# Patient Record
Sex: Male | Born: 1960 | Race: White | Hispanic: No | Marital: Married | Smoking: Never smoker
Health system: Southern US, Community
[De-identification: ages and names within clinical notes are randomized; demographics above are authoritative.]

## PROBLEM LIST (undated history)

## (undated) HISTORY — PX: APPENDECTOMY: SHX54

## (undated) HISTORY — PX: TONSILLECTOMY: SUR1361

## (undated) HISTORY — PX: COLONOSCOPY: SHX5424

---

## 1999-08-06 ENCOUNTER — Encounter (INDEPENDENT_AMBULATORY_CARE_PROVIDER_SITE_OTHER): Payer: Self-pay | Admitting: Specialist

## 1999-08-06 ENCOUNTER — Ambulatory Visit (HOSPITAL_COMMUNITY): Admission: RE | Admit: 1999-08-06 | Discharge: 1999-08-06 | Payer: Self-pay | Admitting: *Deleted

## 2002-10-12 ENCOUNTER — Ambulatory Visit (HOSPITAL_COMMUNITY): Admission: RE | Admit: 2002-10-12 | Discharge: 2002-10-12 | Payer: Self-pay | Admitting: General Surgery

## 2002-10-12 ENCOUNTER — Encounter (INDEPENDENT_AMBULATORY_CARE_PROVIDER_SITE_OTHER): Payer: Self-pay | Admitting: Specialist

## 2007-03-12 ENCOUNTER — Emergency Department (HOSPITAL_COMMUNITY): Admission: EM | Admit: 2007-03-12 | Discharge: 2007-03-12 | Payer: Self-pay | Admitting: Family Medicine

## 2007-03-20 ENCOUNTER — Ambulatory Visit (HOSPITAL_COMMUNITY): Admission: RE | Admit: 2007-03-20 | Discharge: 2007-03-20 | Payer: Self-pay | Admitting: Urology

## 2008-03-08 ENCOUNTER — Ambulatory Visit (HOSPITAL_COMMUNITY): Admission: RE | Admit: 2008-03-08 | Discharge: 2008-03-08 | Payer: Self-pay | Admitting: Gastroenterology

## 2008-07-29 IMAGING — CR DG ABDOMEN 1V
2 series · 2 of 2 positions shown · non-contrast
Comparison: 03/12/2007 abdominal radiograph and CT from [HOSPITAL] on 03/13/2007.

CLINICAL DATA: Preoperative, right ureteral stone.
 ABDOMEN - 1 VIEW:

[view not recorded (1 of 2)]
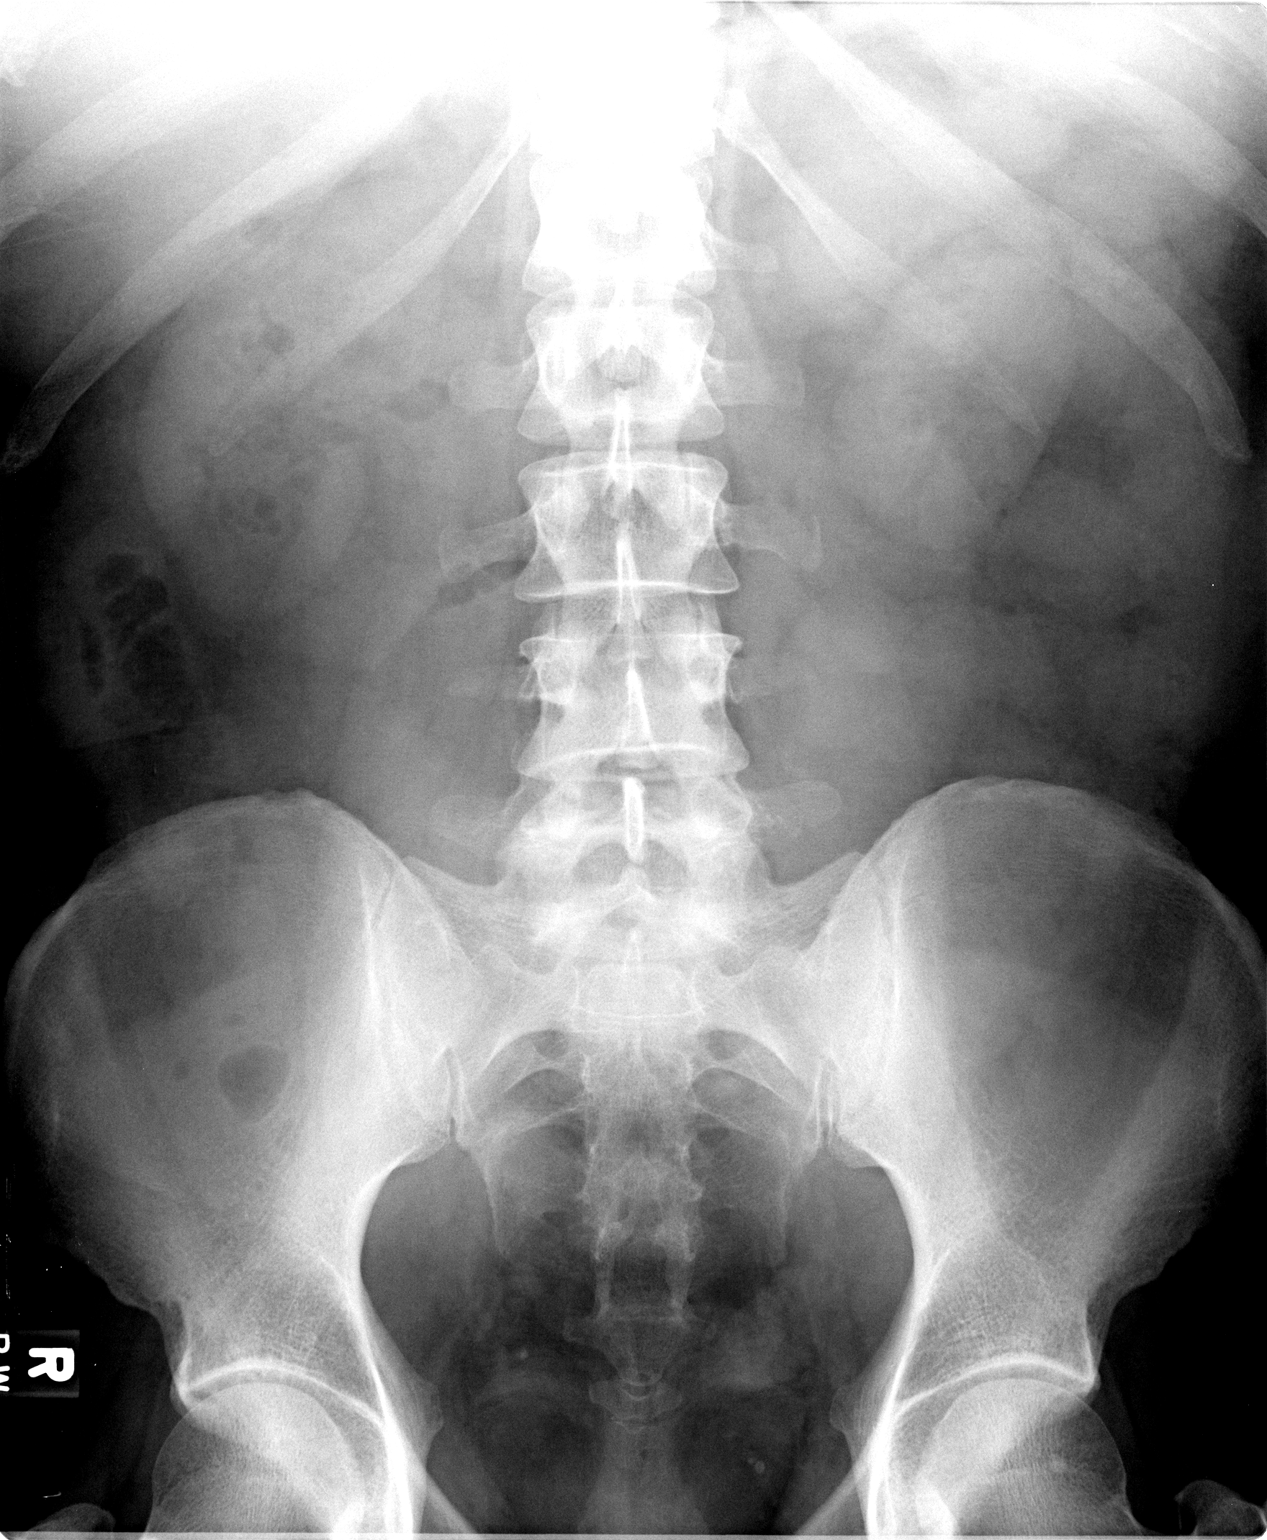

[view not recorded (2 of 2)]
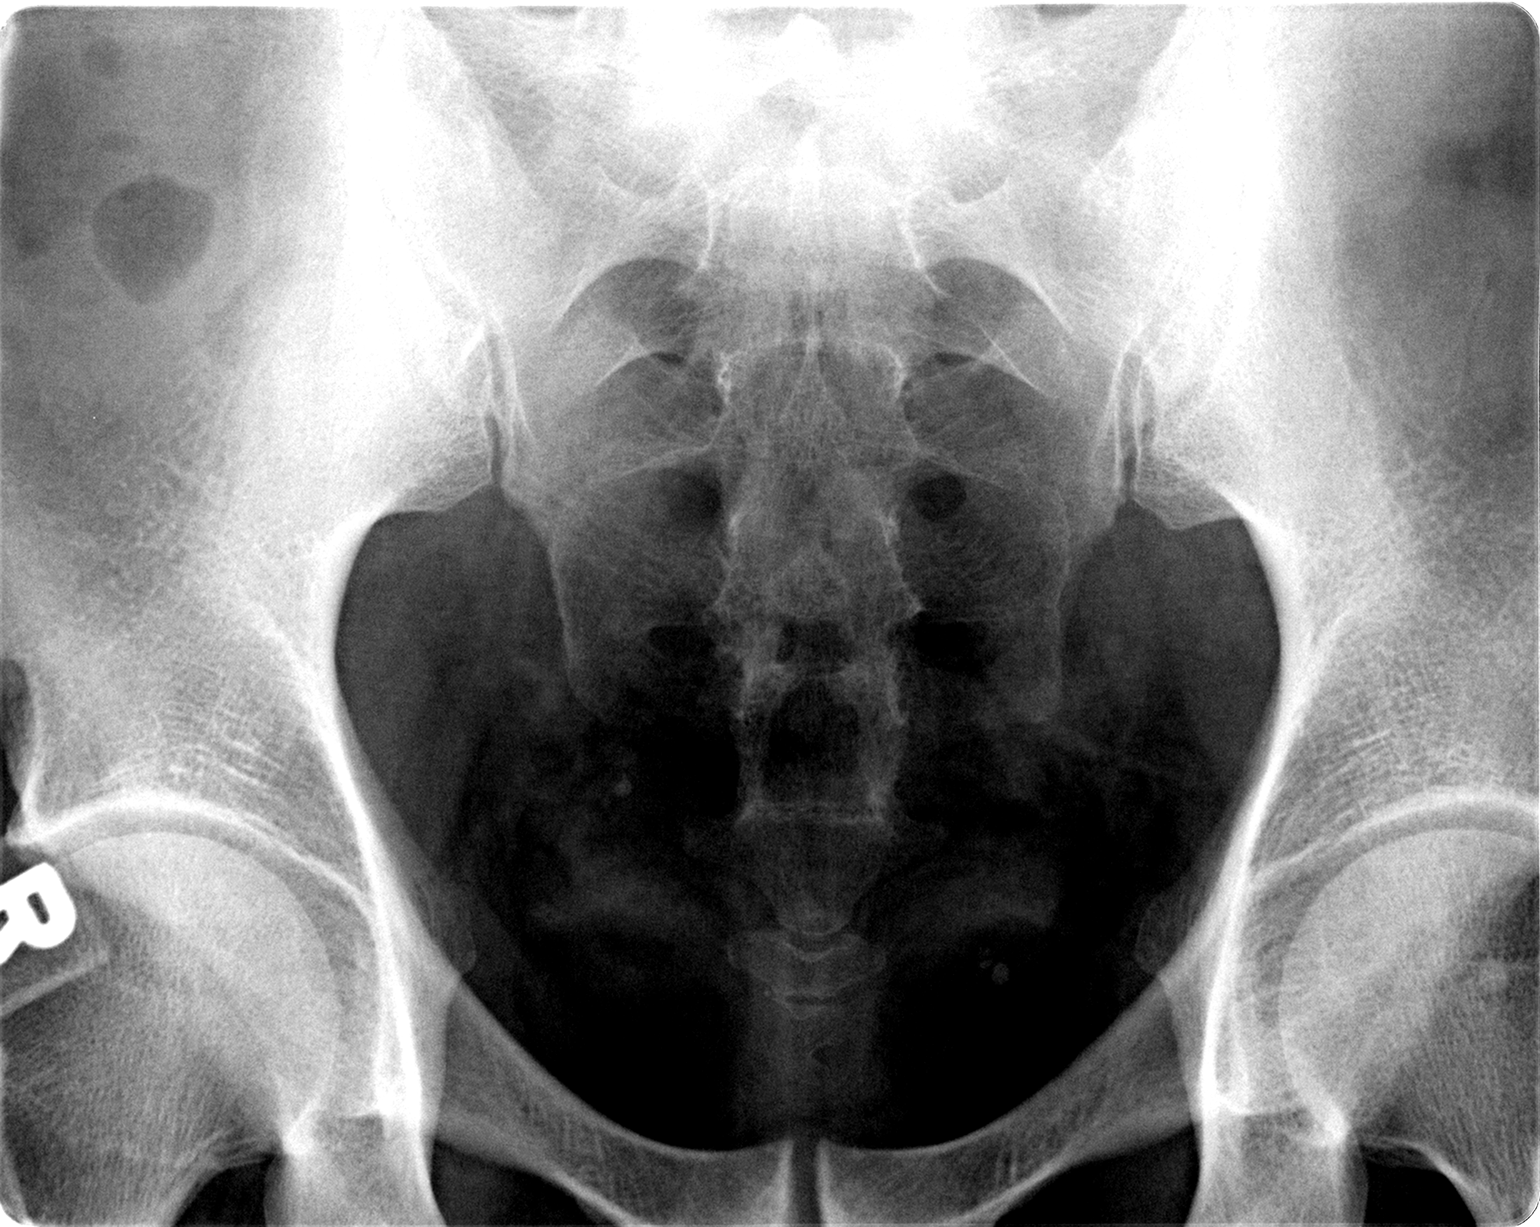

[2 of 2 positions shown; findings below may reference images not displayed]

FINDINGS: The right ureteral calculus appears to have migrated distally over the expected location of the right ureterovesical junction.   No radiopaque calculus is seen over either kidney or the expected course of either ureter elsewhere.   Pelvic phleboliths noted on the left.  Bones are unremarkable.
IMPRESSION: Apparent migration of the previously seen right ureteral calculus to the approximate level of the ureterovesical junction.

## 2010-10-13 NOTE — Op Note (Signed)
Brandon Harrison, Brandon Harrison NO.:  [id number]   MEDICAL RECORD NO.:  [id number]          PATIENT TYPE:  AMB   LOCATION:  ENDO                         FACILITY:  [facility]   PHYSICIAN:  Leo C. Madilyn Fireman, M.D.    DATE OF BIRTH:  Oct 16, 1960   DATE OF PROCEDURE:  03/08/2008  DATE OF DISCHARGE:                               OPERATIVE REPORT   INDICATIONS FOR PROCEDURE:  History of adenomatous colon polyps 5 years  ago.   PROCEDURE:  The patient was placed in the left lateral decubitus  position and placed on the pulse monitor with continuous low-flow oxygen  delivered by nasal cannula.  He was sedated with 100 mcg IV fentanyl and  10 mg IV Versed.  The Olympus video colonoscope was inserted into the  rectum and advanced to the cecum, confirmed by transillumination the  McBurney point and visualization of ileocecal valve and appendiceal  orifice.  The prep was excellent.  The cecum, ascending, transverse,  descending, and sigmoid colon all appeared normal with no masses,  polyps, diverticula, or other mucosal abnormalities.  The rectum  likewise appeared normal.  Retroflexed view of the anus revealed no  obvious internal hemorrhoids.  The scope was then withdrawn, and the  patient returned to the recovery room in stable condition.  He tolerated  the procedure well and there were no immediate complications.   IMPRESSION:  Normal colonoscopy.   PLAN:  Repeat colonoscopy in 5 years.           ______________________________  Everardo All Madilyn Fireman, M.D.     JCH/MEDQ  D:  03/08/2008  T:  03/08/2008  Job:  [id number]

## 2010-10-16 NOTE — Op Note (Signed)
   NAME:  Brandon Harrison, Brandon Harrison                          ACCOUNT NO.:  [id number]   MEDICAL RECORD NO.:  [id number]                   PATIENT TYPE:  AMB   LOCATION:  DAY                                  [facility]ACILITY[facility]  [facility]   PHYSICIAN:  Timothy E. Earlene Plater, M.D.              DATE OF BIRTH:  1960-09-25   DATE OF PROCEDURE:  10/12/2002  DATE OF DISCHARGE:                                 OPERATIVE REPORT   PREOPERATIVE DIAGNOSES:  Internal and external hemorrhoids.   POSTOPERATIVE DIAGNOSES:  Internal and external hemorrhoids.   PROCEDURE:  Hemorrhoidectomy.   SURGEON:  Timothy E. Earlene Plater, M.D.   ANESTHESIA:  General.   INDICATIONS FOR PROCEDURE:  Mr. Pignato has had long-term hemorrhoids and  significant bleeding. He has been treated conservatively and has failed with  ongoing symptoms. Because of a history of colonic polyps, we have arranged a  colonoscopy today which was done and was negative followed by a  hemorrhoidectomy. The patient was identified, permit signed, laboratory data  reviewed and anemia noted.   DESCRIPTION OF PROCEDURE:  He was taken to the operating room, placed  supine, general endotracheal anesthesia administered. He was placed in  lithotomy, perianal area carefully inspected, prepped and draped in the  usual fashion. Marcaine 0.5% with epinephrine mixed 9:1 with Wydase was  injected around and about the anal orifice and massaged in well. External  tags were present, left lateral, left posterior and right posterior. On  anoscopy, a second degree right anterior, left lateral and a fourth degree  with prolapse in the right posterior. The right posterior was incised in  elliptical form and undermining of the edges removed superficial  varicosities and the wound was carefully closed with a running 2-0 Chromic.  The left lateral and right anterior internal hemorrhoids were band ligated.  The external tags were excised and each wound was closed with running 3-0  Chromic. All  areas were inspected, there was no bleeding and all hemorrhoids  were accounted for. All counts were correct. Gelfoam gauze and a dry sterile  dressing applied. He tolerated it well. Dry sterile dressing applied, counts  correct and he was removed to the recovery room in good condition.   Written and verbal instructions were given to him and his wife including  Percocet #36 and he will be seen and followed as an outpatient.                                               Timothy E. Earlene Plater, M.D.    TED/MEDQ  D:  10/12/2002  T:  10/12/2002  Job:  [id number]   cc:   Molly Maduro L. Foy [place], M.D.  [address] [place]  [place] [address]  Fax: [phone]

## 2011-03-11 LAB — POCT URINALYSIS DIP (DEVICE)
Nitrite: NEGATIVE
Specific Gravity, Urine: >=1.03
pH: 6

## 2015-10-06 ENCOUNTER — Other Ambulatory Visit: Payer: Self-pay | Admitting: Family Medicine

## 2018-03-06 ENCOUNTER — Encounter (HOSPITAL_COMMUNITY): Payer: Self-pay

## 2018-03-06 ENCOUNTER — Emergency Department (HOSPITAL_COMMUNITY)
Admission: EM | Admit: 2018-03-06 | Discharge: 2018-03-06 | Disposition: A | Discharge status: Home / Self Care | Payer: Managed Care, Other (non HMO) | Attending: Emergency Medicine | Admitting: Emergency Medicine

## 2018-03-06 ENCOUNTER — Other Ambulatory Visit: Payer: Self-pay

## 2018-03-06 DIAGNOSIS — Y93E6 Activity, residential relocation: Secondary | ICD-10-CM | POA: Insufficient documentation

## 2018-03-06 DIAGNOSIS — S61213A Laceration without foreign body of left middle finger without damage to nail, initial encounter: Secondary | ICD-10-CM | POA: Insufficient documentation

## 2018-03-06 DIAGNOSIS — Y999 Unspecified external cause status: Secondary | ICD-10-CM | POA: Insufficient documentation

## 2018-03-06 DIAGNOSIS — Y92009 Unspecified place in unspecified non-institutional (private) residence as the place of occurrence of the external cause: Secondary | ICD-10-CM | POA: Insufficient documentation

## 2018-03-06 DIAGNOSIS — W260XXA Contact with knife, initial encounter: Secondary | ICD-10-CM | POA: Diagnosis not present

## 2018-03-06 DIAGNOSIS — Z23 Encounter for immunization: Secondary | ICD-10-CM | POA: Diagnosis not present

## 2018-03-06 MED ORDER — BACITRACIN ZINC 500 UNIT/GM EX OINT
TOPICAL_OINTMENT | CUTANEOUS | Status: AC
  Administered 2018-03-06: 1
  Filled 2018-03-06: qty 0.9

## 2018-03-06 MED ORDER — POVIDONE-IODINE 10 % EX SOLN
CUTANEOUS | Status: AC
  Filled 2018-03-06: qty 15

## 2018-03-06 MED ORDER — LIDOCAINE HCL (PF) 1 % IJ SOLN
INTRAMUSCULAR | Status: AC
  Filled 2018-03-06: qty 5

## 2018-03-06 MED ORDER — TETANUS-DIPHTH-ACELL PERTUSSIS 5-2.5-18.5 LF-MCG/0.5 IM SUSP
INTRAMUSCULAR | Status: AC
  Administered 2018-03-06: 0.5 mL via INTRAMUSCULAR
  Filled 2018-03-06: qty 0.5

## 2018-03-06 NOTE — ED Triage Notes (Signed)
Pt has a laceration to left middle finger today from a box cutter. Bleeding controlled at triage. NAD

## 2018-03-06 NOTE — ED Provider Notes (Signed)
[facility] EMERGENCY DEPARTMENT Provider Note   CSN: [id number] Arrival date & time: 03/06/18  1522     History   Chief Complaint Chief Complaint  Patient presents with  . Laceration    HPI Brandon Harrison is a 57 y.o. male.  HPI Patient states he was unpacking and using a box cutter when he slipped and cut the lateral surface of his third digit of his left hand.  He is right-hand dominant.  Bleeding noted.  Denies difficulty moving the finger or numbness.  No other injuries.  Unknown last tetanus. History reviewed. No pertinent past medical history.  There are no active problems to display for this patient.   Past Surgical History:  Procedure Laterality Date  . APPENDECTOMY    . COLONOSCOPY    . TONSILLECTOMY          Home Medications    Prior to Admission medications   Not on File    Family History No family history on file.  Social History Social History   Tobacco Use  . Smoking status: Never Smoker  . Smokeless tobacco: Never Used  Substance Use Topics  . Alcohol use: Not on file    Comment: occasional  . Drug use: Never     Allergies   Patient has no allergy information on record.   Review of Systems Review of Systems  Musculoskeletal: Negative for arthralgias and joint swelling.  Skin: Positive for wound.  Neurological: Negative for weakness and numbness.  All other systems reviewed and are negative.    Physical Exam Updated Vital Signs BP (!) 158/91 (BP Location: Right Arm)   Pulse 70   Temp 98 F (36.7 C) (Oral)   Resp 16   SpO2 94%   Physical Exam  Constitutional: He is oriented to person, place, and time. He appears well-developed and well-nourished.  HENT:  Head: Normocephalic and atraumatic.  Eyes: Pupils are equal, round, and reactive to light. EOM are normal.  Neck: Normal range of motion. Neck supple.  Cardiovascular: Normal rate.  Pulmonary/Chest: Effort normal.  Abdominal: Soft.  Musculoskeletal: Normal range of  motion. He exhibits no edema or tenderness.  Neurological: He is alert and oriented to person, place, and time.  Sensation fully intact to light touch.  Good flexion-extension of the fingers of the left hand.  Skin: Skin is warm and dry. Capillary refill takes less than 2 seconds. No rash noted. No erythema.  Patient with 3 cm laceration to the lateral surface of the distal third digit of the left hand.  No foreign bodies visualized.  Patient does have exposed subcutaneous fat.  Minimal bleeding.  Full range of motion of the DIP.  Good distal cap refill.  Psychiatric: He has a normal mood and affect. His behavior is normal.  Nursing note and vitals reviewed.    ED Treatments / Results  Labs (all labs ordered are listed, but only abnormal results are displayed) Labs Reviewed - No data to display  EKG None  Radiology No results found.  Procedures .Marland KitchenLaceration Repair Date/Time: 03/06/2018 6:42 PM Performed by: Loren Racer, MD Authorized by: Loren Racer, MD   Consent:    Consent obtained:  Verbal Anesthesia (see MAR for exact dosages):    Anesthesia method:  Local infiltration   Local anesthetic:  Lidocaine 1% w/o epi Laceration details:    Location:  Finger   Finger location:  L long finger   Length (cm):  3 Repair type:    Repair type:  Simple Pre-procedure details:    Preparation:  Patient was prepped and draped in usual sterile fashion Exploration:    Wound exploration: wound explored through full range of motion and entire depth of wound probed and visualized     Wound extent: no foreign bodies/material noted and no tendon damage noted     Contaminated: no   Treatment:    Area cleansed with:  Betadine   Amount of cleaning:  Standard   Irrigation solution:  Sterile saline   Irrigation method:  Syringe   Visualized foreign bodies/material removed: no   Skin repair:    Repair method:  Sutures   Suture size:  5-0   Suture material:  Prolene   Number of  sutures:  4 Approximation:    Approximation:  Close Post-procedure details:    Dressing:  Antibiotic ointment   Patient tolerance of procedure:  Tolerated well, no immediate complications   (including critical care time)  Medications Ordered in ED Medications  povidone-iodine (BETADINE) 10 % external solution (has no administration in time range)  lidocaine (PF) (XYLOCAINE) 1 % injection (has no administration in time range)  bacitracin 500 UNIT/GM ointment (has no administration in time range)  Tdap (BOOSTRIX) 5-2.5-18.5 LF-MCG/0.5 injection (0.5 mLs Intramuscular Given 03/06/18 1705)     Initial Impression / Assessment and Plan / ED Course  I have reviewed the triage vital signs and the nursing notes.  Pertinent labs & imaging results that were available during my care of the patient were reviewed by me and considered in my medical decision making (see chart for details).     Wound was repaired.  Tetanus updated.  No evidence of tendon or nerve injury.  Bleeding controlled.  Will need to have sutures removed in 1 week.  Return precautions given.  Final Clinical Impressions(s) / ED Diagnoses   Final diagnoses:  Laceration of left middle finger without foreign body without damage to nail, initial encounter    ED Discharge Orders    None       Loren Racer, MD 03/06/18 1844

## 2018-03-06 NOTE — ED Notes (Signed)
Patient seen and evaluated by EDP for initial assessment. 

## 2018-03-06 NOTE — ED Notes (Signed)
Suture tray to bedside.

## 2021-06-19 DIAGNOSIS — Z0000 Encounter for general adult medical examination without abnormal findings: Secondary | ICD-10-CM | POA: Diagnosis not present

## 2021-06-19 DIAGNOSIS — E782 Mixed hyperlipidemia: Secondary | ICD-10-CM | POA: Diagnosis not present

## 2021-06-19 DIAGNOSIS — Z125 Encounter for screening for malignant neoplasm of prostate: Secondary | ICD-10-CM | POA: Diagnosis not present

## 2021-06-19 DIAGNOSIS — R8290 Unspecified abnormal findings in urine: Secondary | ICD-10-CM | POA: Diagnosis not present

## 2022-08-05 DIAGNOSIS — J018 Other acute sinusitis: Secondary | ICD-10-CM | POA: Diagnosis not present

## 2022-08-05 DIAGNOSIS — R030 Elevated blood-pressure reading, without diagnosis of hypertension: Secondary | ICD-10-CM | POA: Diagnosis not present

## 2022-08-05 DIAGNOSIS — Z6831 Body mass index (BMI) 31.0-31.9, adult: Secondary | ICD-10-CM | POA: Diagnosis not present

## 2022-08-05 DIAGNOSIS — Z20828 Contact with and (suspected) exposure to other viral communicable diseases: Secondary | ICD-10-CM | POA: Diagnosis not present

## 2022-10-26 DIAGNOSIS — F419 Anxiety disorder, unspecified: Secondary | ICD-10-CM | POA: Diagnosis not present

## 2022-10-26 DIAGNOSIS — R6882 Decreased libido: Secondary | ICD-10-CM | POA: Diagnosis not present

## 2022-10-26 DIAGNOSIS — N529 Male erectile dysfunction, unspecified: Secondary | ICD-10-CM | POA: Diagnosis not present

## 2022-10-26 DIAGNOSIS — K219 Gastro-esophageal reflux disease without esophagitis: Secondary | ICD-10-CM | POA: Diagnosis not present

## 2022-11-30 DIAGNOSIS — E782 Mixed hyperlipidemia: Secondary | ICD-10-CM | POA: Diagnosis not present

## 2022-11-30 DIAGNOSIS — R6882 Decreased libido: Secondary | ICD-10-CM | POA: Diagnosis not present

## 2022-11-30 DIAGNOSIS — R739 Hyperglycemia, unspecified: Secondary | ICD-10-CM | POA: Diagnosis not present

## 2022-11-30 DIAGNOSIS — Z79899 Other long term (current) drug therapy: Secondary | ICD-10-CM | POA: Diagnosis not present

## 2023-02-17 DIAGNOSIS — R001 Bradycardia, unspecified: Secondary | ICD-10-CM | POA: Diagnosis not present

## 2023-02-17 DIAGNOSIS — Z79899 Other long term (current) drug therapy: Secondary | ICD-10-CM | POA: Diagnosis not present

## 2023-02-17 DIAGNOSIS — K219 Gastro-esophageal reflux disease without esophagitis: Secondary | ICD-10-CM | POA: Diagnosis not present

## 2023-02-17 DIAGNOSIS — R9439 Abnormal result of other cardiovascular function study: Secondary | ICD-10-CM | POA: Diagnosis not present

## 2023-02-17 DIAGNOSIS — E7800 Pure hypercholesterolemia, unspecified: Secondary | ICD-10-CM | POA: Diagnosis not present

## 2023-02-17 DIAGNOSIS — Z125 Encounter for screening for malignant neoplasm of prostate: Secondary | ICD-10-CM | POA: Diagnosis not present

## 2023-02-17 DIAGNOSIS — R030 Elevated blood-pressure reading, without diagnosis of hypertension: Secondary | ICD-10-CM | POA: Diagnosis not present

## 2023-02-17 DIAGNOSIS — I10 Essential (primary) hypertension: Secondary | ICD-10-CM | POA: Diagnosis not present

## 2023-02-17 DIAGNOSIS — Z0000 Encounter for general adult medical examination without abnormal findings: Secondary | ICD-10-CM | POA: Diagnosis not present

## 2023-02-17 DIAGNOSIS — R9431 Abnormal electrocardiogram [ECG] [EKG]: Secondary | ICD-10-CM | POA: Diagnosis not present

## 2023-02-17 DIAGNOSIS — Z1211 Encounter for screening for malignant neoplasm of colon: Secondary | ICD-10-CM | POA: Diagnosis not present

## 2023-02-17 DIAGNOSIS — Z131 Encounter for screening for diabetes mellitus: Secondary | ICD-10-CM | POA: Diagnosis not present

## 2023-04-01 DIAGNOSIS — E291 Testicular hypofunction: Secondary | ICD-10-CM | POA: Diagnosis not present

## 2023-04-07 DIAGNOSIS — K5730 Diverticulosis of large intestine without perforation or abscess without bleeding: Secondary | ICD-10-CM | POA: Diagnosis not present

## 2023-04-07 DIAGNOSIS — Z09 Encounter for follow-up examination after completed treatment for conditions other than malignant neoplasm: Secondary | ICD-10-CM | POA: Diagnosis not present

## 2023-04-07 DIAGNOSIS — Z860100 Personal history of colon polyps, unspecified: Secondary | ICD-10-CM | POA: Diagnosis not present

## 2023-04-07 DIAGNOSIS — K635 Polyp of colon: Secondary | ICD-10-CM | POA: Diagnosis not present

## 2023-04-07 DIAGNOSIS — K648 Other hemorrhoids: Secondary | ICD-10-CM | POA: Diagnosis not present

## 2023-05-10 DIAGNOSIS — H524 Presbyopia: Secondary | ICD-10-CM | POA: Diagnosis not present

## 2023-07-18 DIAGNOSIS — F419 Anxiety disorder, unspecified: Secondary | ICD-10-CM | POA: Diagnosis not present

## 2023-07-18 DIAGNOSIS — E782 Mixed hyperlipidemia: Secondary | ICD-10-CM | POA: Diagnosis not present

## 2023-07-18 DIAGNOSIS — Z79899 Other long term (current) drug therapy: Secondary | ICD-10-CM | POA: Diagnosis not present

## 2023-07-18 DIAGNOSIS — Z6833 Body mass index (BMI) 33.0-33.9, adult: Secondary | ICD-10-CM | POA: Diagnosis not present

## 2023-07-18 DIAGNOSIS — E6609 Other obesity due to excess calories: Secondary | ICD-10-CM | POA: Diagnosis not present

## 2023-07-22 DIAGNOSIS — E669 Obesity, unspecified: Secondary | ICD-10-CM | POA: Diagnosis not present

## 2023-08-15 DIAGNOSIS — E669 Obesity, unspecified: Secondary | ICD-10-CM | POA: Diagnosis not present

## 2023-08-15 DIAGNOSIS — Z79899 Other long term (current) drug therapy: Secondary | ICD-10-CM | POA: Diagnosis not present

## 2023-08-24 DIAGNOSIS — E669 Obesity, unspecified: Secondary | ICD-10-CM | POA: Diagnosis not present

## 2023-08-24 DIAGNOSIS — E782 Mixed hyperlipidemia: Secondary | ICD-10-CM | POA: Diagnosis not present

## 2023-11-07 DIAGNOSIS — Z6828 Body mass index (BMI) 28.0-28.9, adult: Secondary | ICD-10-CM | POA: Diagnosis not present

## 2023-11-07 DIAGNOSIS — E663 Overweight: Secondary | ICD-10-CM | POA: Diagnosis not present

## 2023-11-07 DIAGNOSIS — L259 Unspecified contact dermatitis, unspecified cause: Secondary | ICD-10-CM | POA: Diagnosis not present
